# Patient Record
Sex: Male | Born: 1995 | Race: White | Hispanic: No | Marital: Single
Health system: Southern US, Community
[De-identification: ages and names within clinical notes are randomized; demographics above are authoritative.]

---
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
## 2015-03-10 ENCOUNTER — Emergency Department: Payer: BLUE CROSS/BLUE SHIELD

## 2015-03-10 ENCOUNTER — Emergency Department
Admission: EM | Admit: 2015-03-10 | Discharge: 2015-03-10 | Disposition: A | Discharge status: Home / Self Care | Payer: BLUE CROSS/BLUE SHIELD | Attending: Emergency Medicine | Admitting: Emergency Medicine

## 2015-03-10 ENCOUNTER — Encounter: Payer: Self-pay | Admitting: Medical Oncology

## 2015-03-10 DIAGNOSIS — S060X0A Concussion without loss of consciousness, initial encounter: Secondary | ICD-10-CM | POA: Insufficient documentation

## 2015-03-10 DIAGNOSIS — X58XXXA Exposure to other specified factors, initial encounter: Secondary | ICD-10-CM | POA: Insufficient documentation

## 2015-03-10 DIAGNOSIS — Y9361 Activity, american tackle football: Secondary | ICD-10-CM | POA: Diagnosis not present

## 2015-03-10 DIAGNOSIS — Y998 Other external cause status: Secondary | ICD-10-CM | POA: Insufficient documentation

## 2015-03-10 DIAGNOSIS — Z79899 Other long term (current) drug therapy: Secondary | ICD-10-CM | POA: Insufficient documentation

## 2015-03-10 DIAGNOSIS — S0990XA Unspecified injury of head, initial encounter: Secondary | ICD-10-CM | POA: Diagnosis present

## 2015-03-10 DIAGNOSIS — Y92321 Football field as the place of occurrence of the external cause: Secondary | ICD-10-CM | POA: Insufficient documentation

## 2015-03-10 DIAGNOSIS — R51 Headache: Secondary | ICD-10-CM | POA: Diagnosis not present

## 2015-03-10 DIAGNOSIS — R112 Nausea with vomiting, unspecified: Secondary | ICD-10-CM | POA: Diagnosis not present

## 2015-03-10 DIAGNOSIS — R110 Nausea: Secondary | ICD-10-CM | POA: Diagnosis not present

## 2015-03-10 LAB — COMPREHENSIVE METABOLIC PANEL
ALT: 24 U/L (ref 17–63)
ANION GAP: 3 — AB (ref 5–15)
AST: 22 U/L (ref 15–41)
Albumin: 4.7 g/dL (ref 3.5–5.0)
Alkaline Phosphatase: 60 U/L (ref 38–126)
BUN: 19 mg/dL (ref 6–20)
CALCIUM: 9.3 mg/dL (ref 8.9–10.3)
CHLORIDE: 108 mmol/L (ref 101–111)
CO2: 28 mmol/L (ref 22–32)
CREATININE: 0.82 mg/dL (ref 0.61–1.24)
Glucose, Bld: 96 mg/dL (ref 65–99)
Potassium: 4.3 mmol/L (ref 3.5–5.1)
SODIUM: 139 mmol/L (ref 135–145)
Total Bilirubin: 1.3 mg/dL — ABNORMAL HIGH (ref 0.3–1.2)
Total Protein: 7.6 g/dL (ref 6.5–8.1)

## 2015-03-10 LAB — CBC WITH DIFFERENTIAL/PLATELET
Basophils Absolute: 0 10*3/uL (ref 0–0.1)
Basophils Relative: 1 %
EOS ABS: 0.1 10*3/uL (ref 0–0.7)
EOS PCT: 2 %
HCT: 43.2 % (ref 40.0–52.0)
Hemoglobin: 15.1 g/dL (ref 13.0–18.0)
LYMPHS ABS: 1.3 10*3/uL (ref 1.0–3.6)
LYMPHS PCT: 21 %
MCH: 31.7 pg (ref 26.0–34.0)
MCHC: 35 g/dL (ref 32.0–36.0)
MCV: 90.7 fL (ref 80.0–100.0)
MONO ABS: 0.8 10*3/uL (ref 0.2–1.0)
MONOS PCT: 14 %
Neutro Abs: 3.8 10*3/uL (ref 1.4–6.5)
Neutrophils Relative %: 62 %
PLATELETS: 234 10*3/uL (ref 150–440)
RBC: 4.77 MIL/uL (ref 4.40–5.90)
RDW: 13 % (ref 11.5–14.5)
WBC: 6 10*3/uL (ref 3.8–10.6)

## 2015-03-10 MED ORDER — METOCLOPRAMIDE HCL 5 MG/ML IJ SOLN
10.0000 mg | Freq: Once | INTRAMUSCULAR | Status: AC
  Administered 2015-03-10: 10 mg via INTRAVENOUS
  Filled 2015-03-10: qty 2

## 2015-03-10 MED ORDER — DIPHENHYDRAMINE HCL 50 MG/ML IJ SOLN
12.5000 mg | Freq: Once | INTRAMUSCULAR | Status: AC
  Administered 2015-03-10: 12.5 mg via INTRAVENOUS
  Filled 2015-03-10: qty 1

## 2015-03-10 MED ORDER — SODIUM CHLORIDE 0.9 % IV BOLUS (SEPSIS)
1000.0000 mL | Freq: Once | INTRAVENOUS | Status: AC
  Administered 2015-03-10: 1000 mL via INTRAVENOUS

## 2015-03-10 NOTE — ED Notes (Signed)
Patient transported to CT 

## 2015-03-10 NOTE — Discharge Instructions (Signed)
Take tylenol, motrin for headaches.   Stay hydrated.   Don't return to sports until you have no headaches or vomiting or dizziness for 2 days.   See your doctor.   Return to ER if you have worse headaches, vomiting, trouble walking.    Post-Concussion Syndrome Post-concussion syndrome is the symptoms that can occur after a head injury. These symptoms can last from weeks to months. HOME CARE   Take medicines only as told by your doctor.  Do not take aspirin.  Sleep with your head raised to help with headaches.  Avoid activities that can cause another head injury.  Do not play contact sports like football, hockey, soccer, or basketball.  Do not do other risky activities like downhill skiing, martial arts, or horseback riding until your doctor says it is okay.  Keep all follow-up visits as told by your doctor. This is important. GET HELP IF:   You have a harder time:  Paying attention.  Focusing.  Remembering.  Learning new information.  Dealing with stress.  You need more time to complete tasks.  You are easily bothered (irritable).  You have more symptoms. Get help if you have any of these symptoms for more than two weeks after your injury:   Long-lasting (chronic) headaches.  Dizziness.  Trouble balancing.  Feeling sick to your stomach (nauseous).  Trouble with your vision.  Noise or light bothers you more.  Depression.  Mood swings.  Feeling worried (anxious).  Easily bothered.  Memory problems.  Trouble concentrating or paying attention.  Sleep problems.  Feeling tired all of the time. GET HELP RIGHT AWAY IF:  You feel confused.  You feel very sleepy.  You are hard to wake up.  You feel sick to your stomach.  You keep throwing up (vomiting).  You feel like you are moving when you are not (vertigo).  Your eyes move back and forth very quickly.  You start shaking (convulsing) or pass out (faint).  You have very bad headaches  that do not get better with medicine.  You cannot use your arms or legs like normal.  One of the black centers of your eyes (pupils) is bigger than the other.  You have clear or bloody fluid coming from your nose or ears.  Your problems get worse, not better. MAKE SURE YOU:  Understand these instructions.  Will watch your condition.  Will get help right away if you are not doing well or get worse.   This information is not intended to replace advice given to you by your health care provider. Make sure you discuss any questions you have with your health care provider.   Document Released: 06/28/2004 Document Revised: 06/11/2014 Document Reviewed: 08/26/2013 Elsevier Interactive Patient Education [employer]

## 2015-03-10 NOTE — ED Notes (Signed)
Pt ambulatory to triage with reports that he had an head injury yesterday during foot ball practice. Pt reports that he has vomited x 1 since then and he continues to have headache with light sensitivity.

## 2015-03-10 NOTE — ED Provider Notes (Signed)
CSN: [id number]     Arrival date & time 03/10/15  1019 History   First MD Initiated Contact with Patient 03/10/15 1027     Chief Complaint  Patient presents with  . Head Injury     (Consider location/radiation/quality/duration/timing/severity/associated sxs/prior Treatment) The history is provided by the patient.  Dakota Best is a 19 y.o. male here presenting with headache, vomiting. 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 Denies any loss of consciousness during practice. He felt fine until 2 hours after practice, he was cooking dinner, and then suddenly had episode vomiting. He also vomited a second time afterwards. Severe headaches as well as photophobia afterwards. Didn't take anything for the headaches. Denies any focal weakness or trouble speaking. No history of migraines and otherwise healthy.   History reviewed. No pertinent past medical history. No past surgical history on file. No family history on file. Social History  Substance Use Topics  . Smoking status: None  . Smokeless tobacco: None  . Alcohol Use: None    Review of Systems  Neurological: Positive for headaches.  All other systems reviewed and are negative.     Allergies  Review of patient's allergies indicates no known allergies.  Home Medications   Prior to Admission medications   Medication Sig Start Date End Date Taking? Authorizing Provider  dexmethylphenidate (FOCALIN XR) 20 MG 24 hr capsule Take 40 mg by mouth daily.   Yes Historical Provider, MD   BP 136/83 mmHg  Pulse 73  Temp(Src) 97.8 F (36.6 C) (Oral)  Resp 18  Ht  (1.854 m)  Wt 225 lb (102.059 kg)  BMI 29.69 kg/m2  SpO2 97% Physical Exam  Constitutional: He is oriented to person, place, and time.  Uncomfortable   HENT:  Head: Normocephalic.  Mouth/Throat: Oropharynx is clear and moist.  Eyes:  Conjunctivae and EOM are normal. Pupils are equal, round, and reactive to light.  No obvious papilledema   Neck: Normal range of motion. Neck supple.  Cardiovascular: Normal rate, regular rhythm and normal heart sounds.   Pulmonary/Chest: Effort normal. No respiratory distress. He has no wheezes. He has no rales.  Abdominal: Soft. Bowel sounds are normal. He exhibits no distension. There is no tenderness. There is no rebound.  Musculoskeletal: Normal range of motion. He exhibits no edema or tenderness.  Neurological: He is alert and oriented to person, place, and time.  Uncomfortable. CN 2-12 intact. Eyes closed but extra ocular movements intact. Nl strength throughout, nl sensation throughout   Skin: Skin is warm and dry.  Psychiatric: He has a normal mood and affect. His behavior is normal. Judgment and thought content normal.  Nursing note and vitals reviewed.   ED Course  Procedures (including critical care time) Labs Review Labs Reviewed  COMPREHENSIVE METABOLIC PANEL - Abnormal; Notable for the following:    Total Bilirubin 1.3 (*)    Anion gap 3 (*)    All other components within normal limits  CBC WITH DIFFERENTIAL/PLATELET    Imaging Review Ct Head Wo Contrast  03/10/2015   CLINICAL DATA:  Head injury playing football yesterday. Headaches and light sensitivity. Vomiting.  EXAM: CT HEAD WITHOUT CONTRAST  TECHNIQUE: Contiguous axial images were obtained from the base of the skull through the vertex without intravenous contrast.  COMPARISON:  None.  FINDINGS: The ventricles are normal in size and configuration. No  extra-axial fluid collections are identified. The gray-white differentiation is normal. No CT findings for acute intracranial process such as hemorrhage or infarction. No mass lesions. The brainstem and cerebellum are grossly normal.  The bony structures are intact. The paranasal sinuses and mastoid air cells are clear. The globes are intact.  IMPRESSION: Normal head CT.    Electronically Signed   By: Rudie Meyer M.D.   On: 03/10/2015 11:04   I have personally reviewed and evaluated these images and lab results as part of my medical decision-making.   EKG Interpretation None      MDM   Final diagnoses:  None    Dakota Best is a 19 y.o. male here with headache, vomiting after head injury. Likely post concussive symptoms. But given that he vomited twice, will get CT head to r/o bleed. Will give migraine cocktail.   11:50 AM CT head unremarkable. Labs unremarkable. Felt better with migraine cocktail. Likely concussion. No return to sports until symptom free for 48 hrs.      Richardean Canal, MD 03/10/15 1150

## 2015-03-14 DIAGNOSIS — S060X1A Concussion with loss of consciousness of 30 minutes or less, initial encounter: Secondary | ICD-10-CM | POA: Diagnosis not present

## 2016-02-21 IMAGING — CR LEFT RING FINGER 2+V
1 series · 3 of 3 positions shown · non-contrast
Comparison: None.

CLINICAL DATA: Fourth finger pain and swelling, injury yesterday,
hit finger on a box

EXAM:
LEFT RING FINGER 2+V

[Series 1: kdxr finger ring 4th dig lt hand · 0.14mm/px · 3 of 3 slices shown]
[im 1/3]
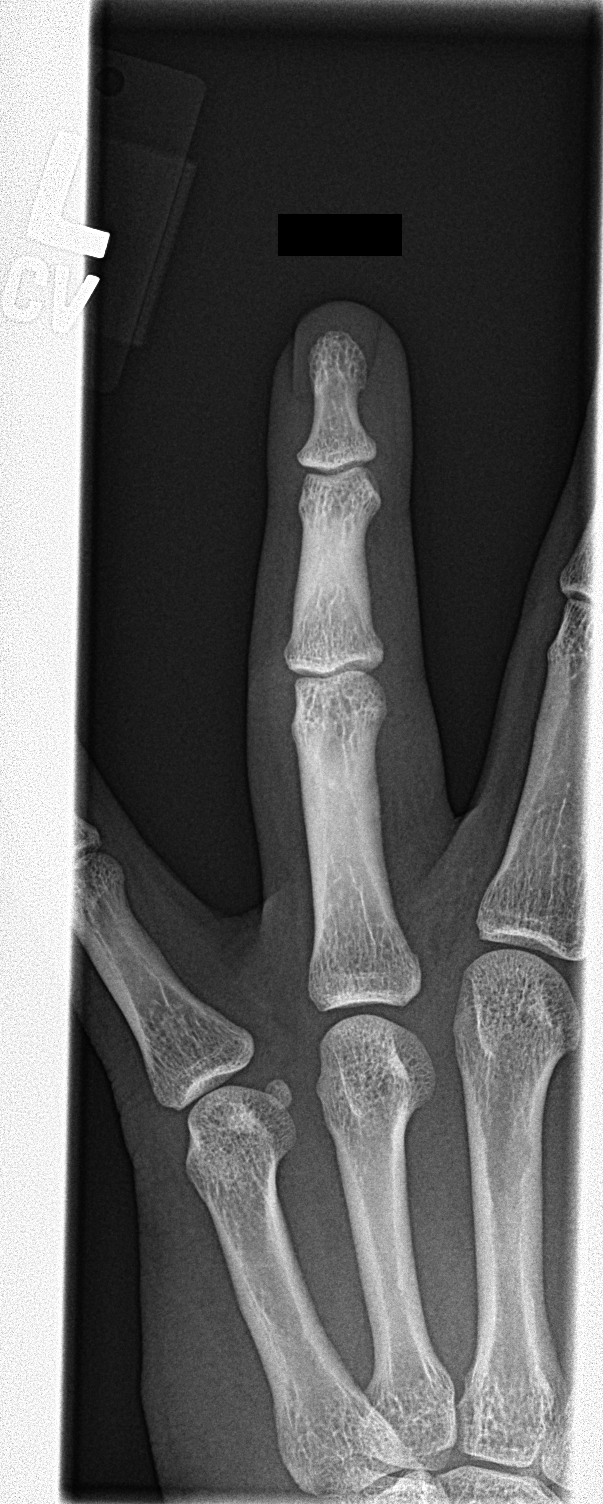
[im 2/3]
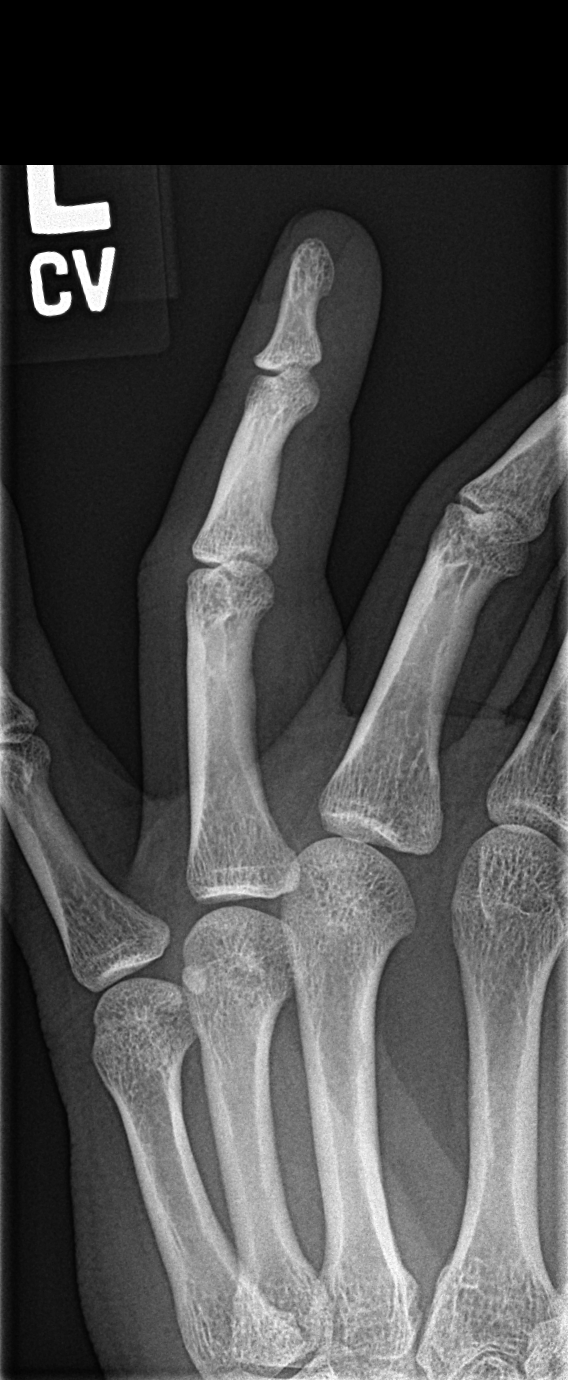
[im 3/3]
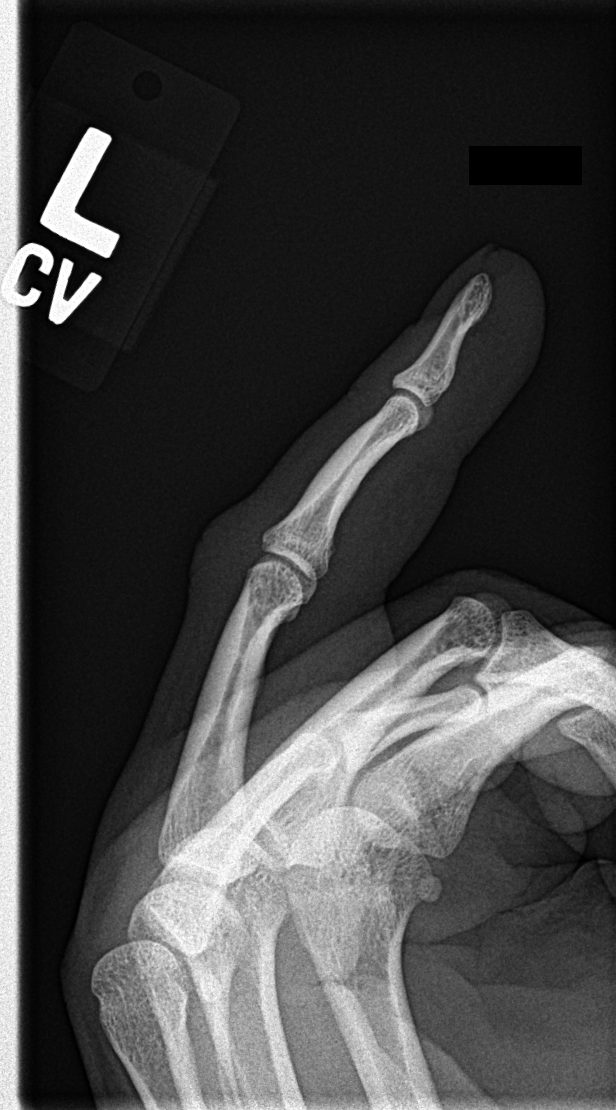

[3 of 3 positions shown; findings below may reference images not displayed]

FINDINGS: Three views of the left fourth finger submitted. No acute fracture
or subluxation. Mild soft tissue swelling dorsally adjacent to
proximal interphalangeal joint.
IMPRESSION: No acute fracture or subluxation.  Mild soft tissue swelling.

## 2016-09-20 IMAGING — CT CT HEAD W/O CM
1 series · 16 of 30 positions shown, 20 images · non-contrast
Comparison: None.

CLINICAL DATA: Head injury playing football yesterday. Headaches
and light sensitivity. Vomiting.

EXAM:
CT HEAD WITHOUT CONTRAST
TECHNIQUE: Contiguous axial images were obtained from the base of the skull
through the vertex without intravenous contrast.

[Series 2: head wo · axial · 0.40mm/px · z∈[+33,+186]mm · 16 of 36 slices shown, 20 images]
[im 2/36  brain]
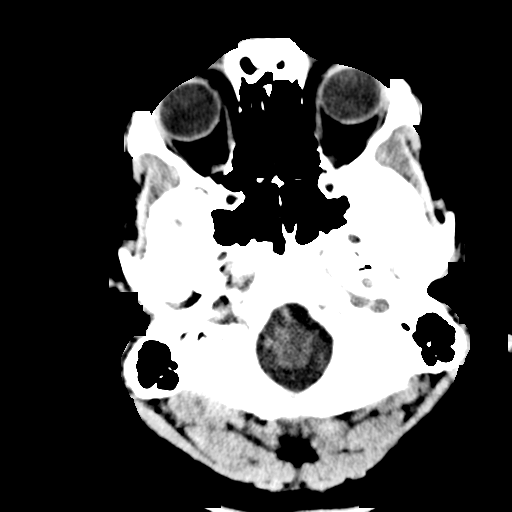
[im 2/36  bone]
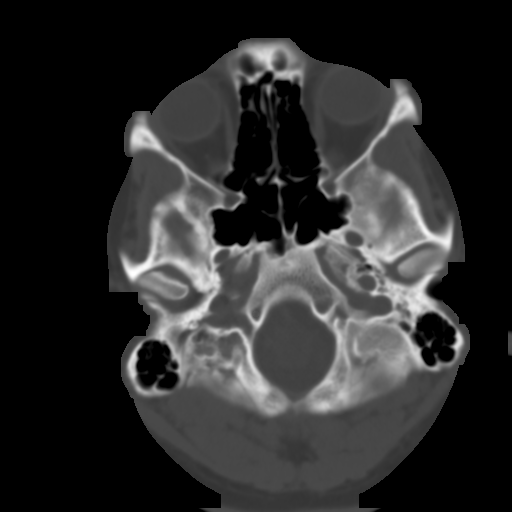
[im 4/36  brain]
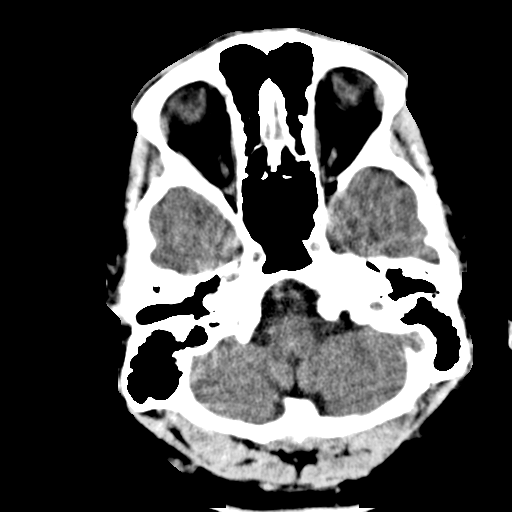
[im 7/36  brain]
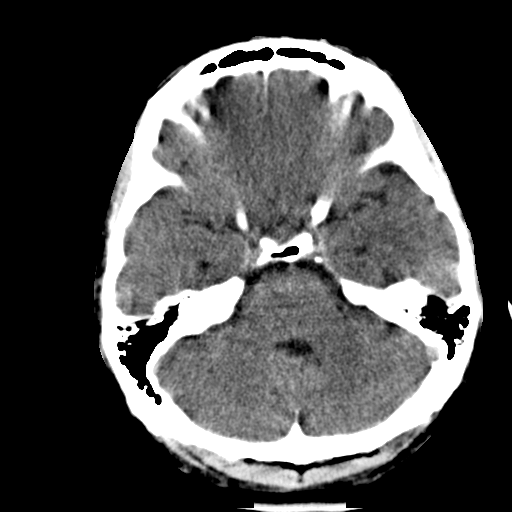
[im 9/36  brain]
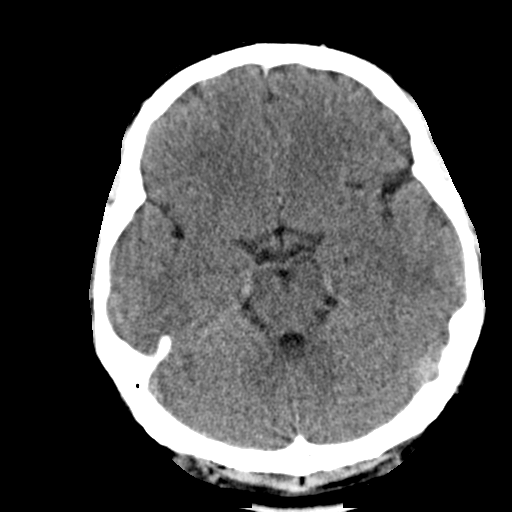
[im 10/36  brain]
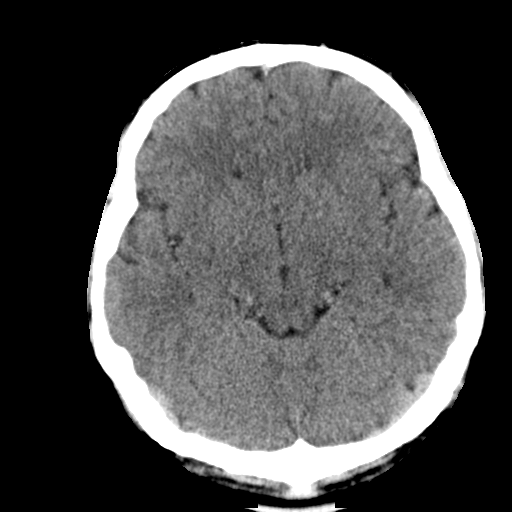
[im 10/36  bone]
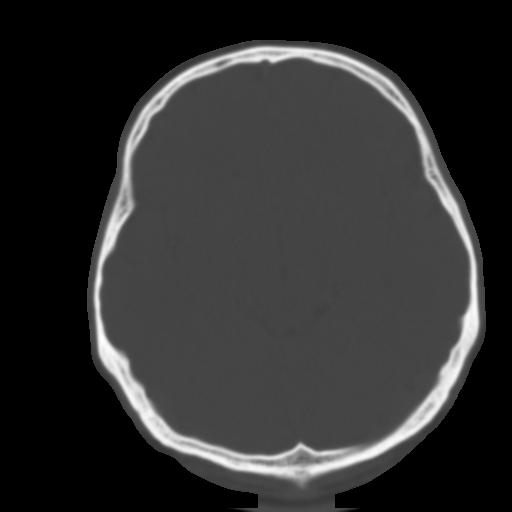
[im 13/36  brain]
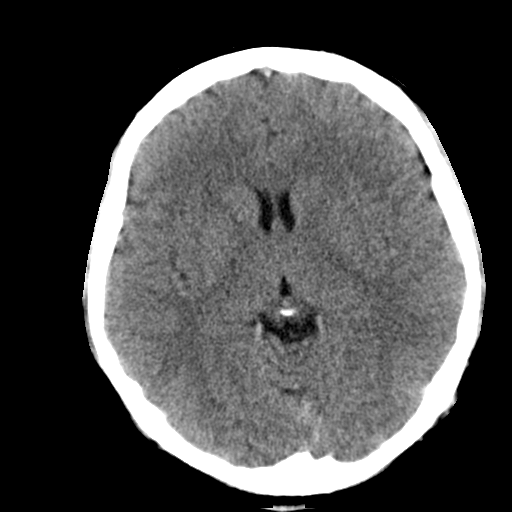
[im 15/36  brain]
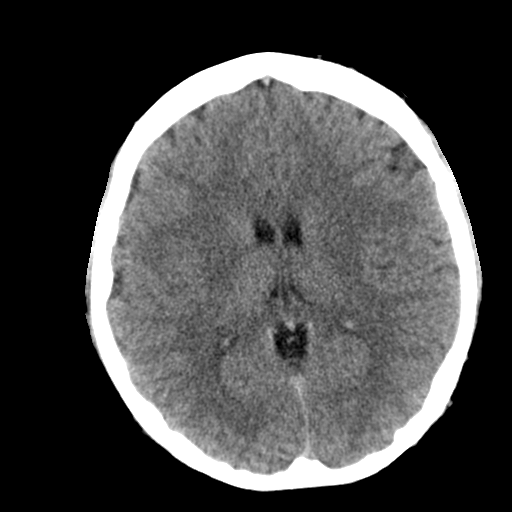
[im 17/36  brain]
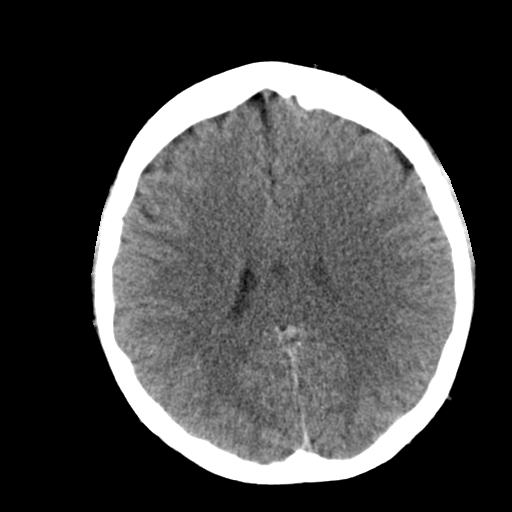
[im 19/36  brain]
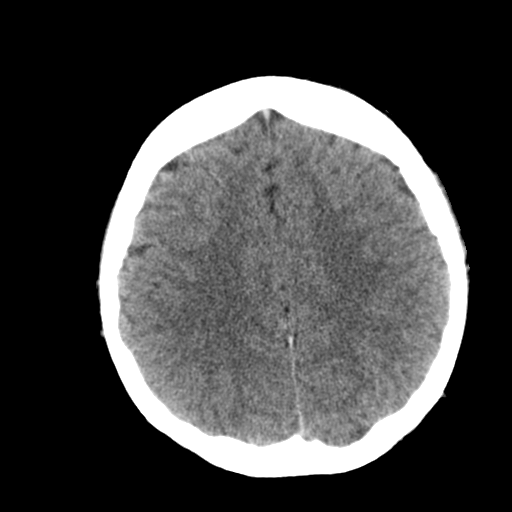
[im 19/36  bone]
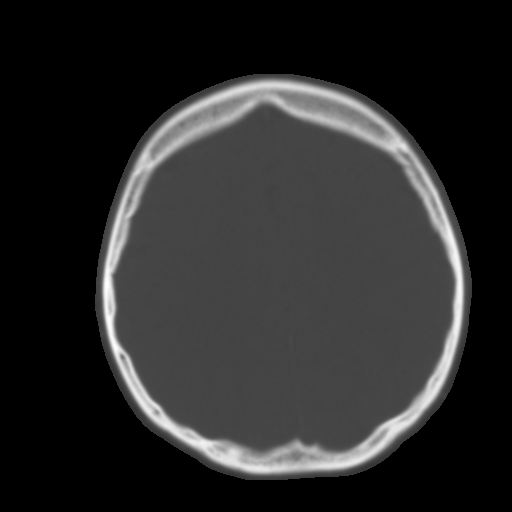
[im 21/36  brain]
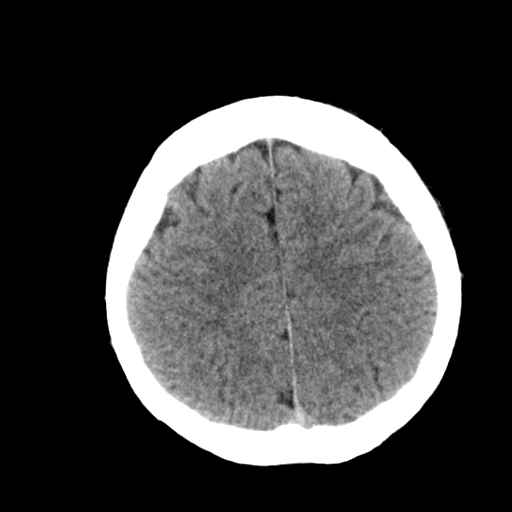
[im 23/36  brain]
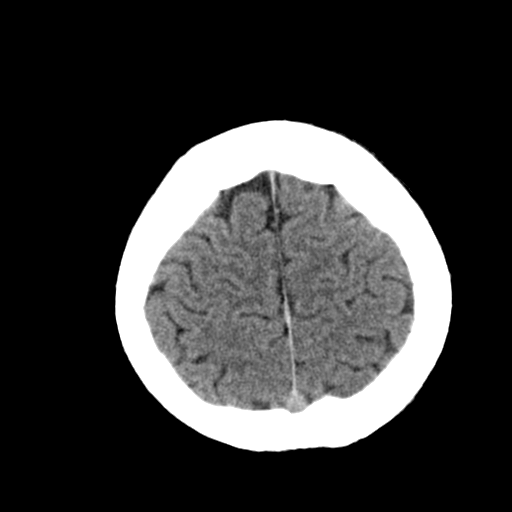
[im 26/36  brain]
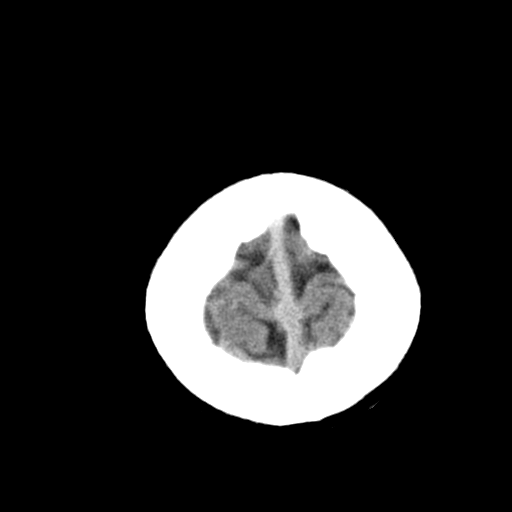
[im 27/36  brain]
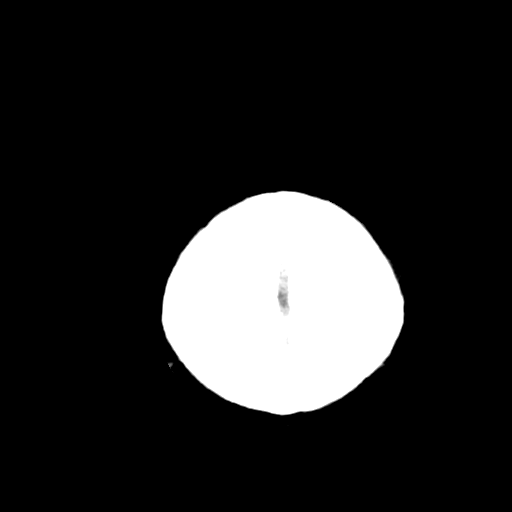
[im 27/36  bone]
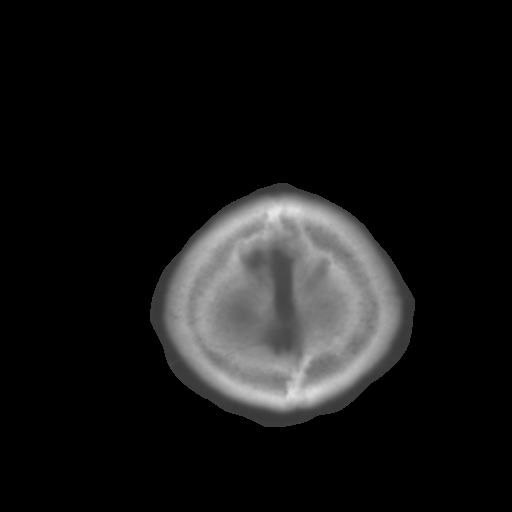
[im 29/36  brain]
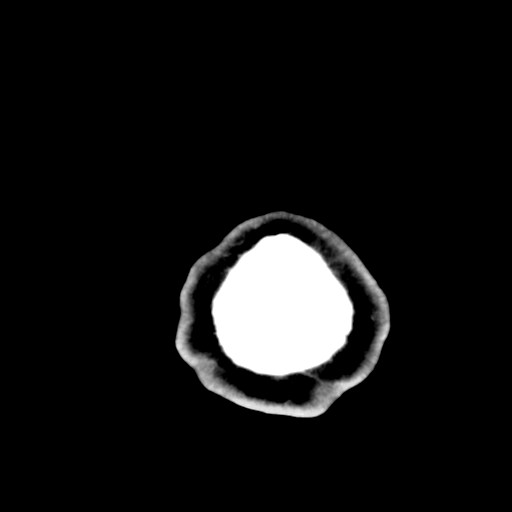
[im 32/36  brain]
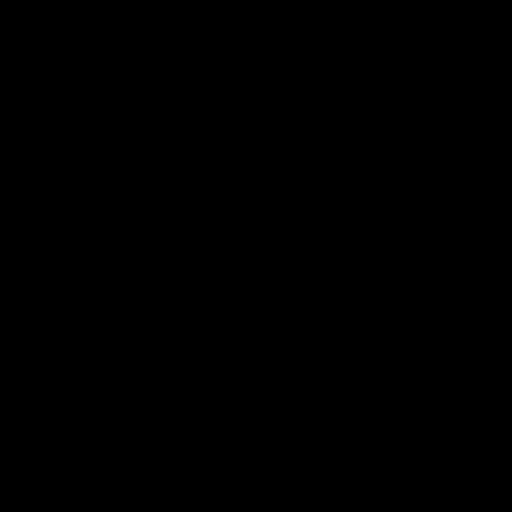
[im 34/36  brain]
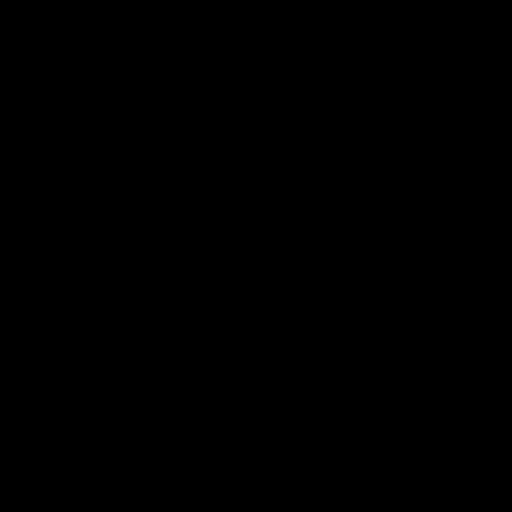

[16 of 30 positions shown; findings below may reference images not displayed]

FINDINGS: The ventricles are normal in size and configuration. No extra-axial
fluid collections are identified. The gray-white differentiation is
normal. No CT findings for acute intracranial process such as
hemorrhage or infarction. No mass lesions. The brainstem and
cerebellum are grossly normal.

The bony structures are intact. The paranasal sinuses and mastoid
air cells are clear. The globes are intact.
IMPRESSION: Normal head CT.

## 2017-03-29 ENCOUNTER — Ambulatory Visit (INDEPENDENT_AMBULATORY_CARE_PROVIDER_SITE_OTHER): Payer: BLUE CROSS/BLUE SHIELD | Admitting: Family Medicine

## 2017-03-29 DIAGNOSIS — Z87898 Personal history of other specified conditions: Secondary | ICD-10-CM

## 2017-03-29 NOTE — Progress Notes (Signed)
Patient presents today to discuss recent presyncopal episode. Patient states that a few days ago he was sitting at the dining hall eating with his friends when he got a "weird feeling in the pit of his stomach." He denies any chest pain, palpitations, shortness of breath. He states that he got up to get water since he thought that may help and he dropped to his knees and felt like he couldn't talk or see clearly. He denies actually falling unconscious. His friends helped him up and his symptoms improved some. His friends told him that he looked blue. He said he had a difficult time having a conversation with his mother on the phone after the event. He states he couldn't find the words. He also had a lingering headache for a few days. He denies any symptoms today. He did not go to the ER after the event. He was seen at [facility] and was sent to Cardiology and Neurology for evaluation/treatment. He has several tests that have been ordered (Holter monitor, echo, stress test, MRI head, EEG, etc.) and labs ordered as well. Patient denies any history of similar symptoms in the past. 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 He has not had any lingering postconcussive symptoms since he stopped playing football. He denies any migraine or seizure history. He denies any history of diabetes, thyroid disease or hyper/hypotension. He denies any history of 7 cardiac death in his family. He denies taking any new medications or supplements. He denies any illicit drug use or alcohol use in the days leading up to the event.  Review of systems negative except mentioned above.  Vitals WNL Exam Deferred  A/P: Discussed with patient that I feel that he is getting the appropriate workup for this event. Given the history above seems to be more neurologic in origin. Currently he has no symptoms. I told him that I would speak with Dr. Sherryll BurgerShah who is the neurologist that he saw. If he has any further  symptoms/events he is to seek immediate medical attention.

## 2017-04-02 ENCOUNTER — Other Ambulatory Visit: Payer: Self-pay | Admitting: Neurology

## 2017-04-02 DIAGNOSIS — R55 Syncope and collapse: Secondary | ICD-10-CM

## 2017-04-09 ENCOUNTER — Ambulatory Visit
Admission: RE | Admit: 2017-04-09 | Discharge: 2017-04-09 | Disposition: A | Discharge status: Home / Self Care | Payer: BLUE CROSS/BLUE SHIELD | Source: Ambulatory Visit | Attending: Neurology | Admitting: Neurology

## 2017-04-09 DIAGNOSIS — R55 Syncope and collapse: Secondary | ICD-10-CM | POA: Insufficient documentation

## 2017-04-09 MED ORDER — GADOBENATE DIMEGLUMINE 529 MG/ML IV SOLN
20.0000 mL | Freq: Once | INTRAVENOUS | Status: AC | PRN
  Administered 2017-04-09: 20 mL via INTRAVENOUS
